# Patient Record
Sex: Male | Born: 1940
Health system: Southern US, Community
[De-identification: ages and names within clinical notes are randomized; demographics above are authoritative.]

---

## 2017-06-30 DIAGNOSIS — R079 Chest pain, unspecified: Secondary | ICD-10-CM | POA: Diagnosis not present

## 2017-07-01 DIAGNOSIS — I10 Essential (primary) hypertension: Secondary | ICD-10-CM

## 2017-07-01 DIAGNOSIS — R079 Chest pain, unspecified: Secondary | ICD-10-CM

## 2017-07-01 DIAGNOSIS — I214 Non-ST elevation (NSTEMI) myocardial infarction: Secondary | ICD-10-CM

## 2017-07-01 DIAGNOSIS — E785 Hyperlipidemia, unspecified: Secondary | ICD-10-CM

## 2018-05-11 DIAGNOSIS — R6884 Jaw pain: Secondary | ICD-10-CM

## 2018-05-11 DIAGNOSIS — I214 Non-ST elevation (NSTEMI) myocardial infarction: Secondary | ICD-10-CM | POA: Diagnosis not present

## 2018-05-11 DIAGNOSIS — E785 Hyperlipidemia, unspecified: Secondary | ICD-10-CM

## 2019-07-10 ENCOUNTER — Ambulatory Visit: Payer: Self-pay

## 2019-07-10 ENCOUNTER — Ambulatory Visit (INDEPENDENT_AMBULATORY_CARE_PROVIDER_SITE_OTHER): Payer: Medicare Other | Admitting: Orthopaedic Surgery

## 2019-07-10 DIAGNOSIS — M25562 Pain in left knee: Secondary | ICD-10-CM

## 2019-07-10 MED ORDER — METHYLPREDNISOLONE ACETATE 40 MG/ML IJ SUSP
40.0000 mg | INTRAMUSCULAR | Status: AC | PRN
  Administered 2019-07-10: 17:00:00 40 mg via INTRA_ARTICULAR

## 2019-07-10 MED ORDER — LIDOCAINE HCL 1 % IJ SOLN
3.0000 mL | INTRAMUSCULAR | Status: AC | PRN
  Administered 2019-07-10: 17:00:00 3 mL

## 2019-07-10 NOTE — Progress Notes (Signed)
Office Visit Note   Patient: Edwin Hutchinson           Date of Birth: 1940/12/08           MRN: [id number] Visit Date: 07/10/2019              Requested by: Nicoletta Dress, MD [address],  [place] [address] PCP: Nicoletta Dress, MD   Assessment & Plan: Visit Diagnoses:  1. Acute pain of left knee     Plan: I did feel that it was worth trying a one-time steroid injection in his left knee and he agreed with this as well.  He understands the rationale behind injections as well as the risk and benefits involved.  He tolerated the injection well.  All question concerns were answered addressed.  Follow-up will be as needed.  I did communicate with he and his wife that if he continues to have problems in his knee over the next few weeks that he should call because I would next order an MRI of his left knee to rule out a meniscal tear.  Follow-Up Instructions: Return if symptoms worsen or fail to improve.   Orders:  Orders Placed This Encounter  Procedures  . Large Joint Inj  . XR Knee 1-2 Views Left   No orders of the defined types were placed in this encounter.     Procedures: Large Joint Inj: L knee on 07/10/2019 5:15 PM Indications: diagnostic evaluation and pain Details: 22 G 1.5 in needle, superolateral approach  Arthrogram: No  Medications: 3 mL lidocaine 1 %; 40 mg methylPREDNISolone acetate 40 MG/ML Outcome: tolerated well, no immediate complications Procedure, treatment alternatives, risks and benefits explained, specific risks discussed. Consent was given by the patient. Immediately prior to procedure a time out was called to verify the correct patient, procedure, equipment, support staff and site/side marked as required. Patient was prepped and draped in the usual sterile fashion.       Clinical Data: No additional findings.   Subjective: Chief Complaint  Patient presents with  . Left Knee - Pain  The patient comes in today  with acute left knee pain.  He had some type of pop that he felt the back of his knee on Monday.  He was just simply walking.  He denies any swelling but is been very full and painful in the back of his left knee.  He is never injured this knee before.  He is a very active 78 year old gentleman and does a lot of work outside.  He denies any hip pain.  Denies any numbness and tingling in his left lower extremity.  It is only really the back of his knee that hurts.  HPI  Review of Systems He currently denies any headache, chest pain, shortness of breath, fever, chills, nausea, vomiting  Objective: Vital Signs: There were no vitals taken for this visit.  Physical Exam The patient is alert and oriented x3 and in no acute distress Ortho Exam Examination of his left knee shows no effusion.  His range of motion is full.  He is painful in the popliteal area of his knee.  The pain does not over the gastrocs at all.  It seems to be more over the hamstring area and the knee joint itself.  His McMurray's exam and Lockman's exam are negative and his range of motion is full. Specialty Comments:  No specialty comments available.  Imaging: Xr Knee 1-2 Views Left  Result Date: 07/10/2019 An AP and lateral of the left knee show no acute findings.  The joint space is well-maintained and the alignment is well-maintained.    PMFS History: There are no active problems to display for this patient.  No past medical history on file.  No family history on file.   Social History   Occupational History  . Not on file  Tobacco Use  . Smoking status: Not on file  Substance and Sexual Activity  . Alcohol use: Not on file  . Drug use: Not on file  . Sexual activity: Not on file

## 2019-07-22 ENCOUNTER — Telehealth: Payer: Self-pay

## 2019-07-22 ENCOUNTER — Ambulatory Visit (INDEPENDENT_AMBULATORY_CARE_PROVIDER_SITE_OTHER): Payer: Medicare Other | Admitting: Physician Assistant

## 2019-07-22 ENCOUNTER — Ambulatory Visit (HOSPITAL_COMMUNITY)
Admission: RE | Admit: 2019-07-22 | Discharge: 2019-07-22 | Disposition: A | Discharge status: Home / Self Care | Payer: Medicare Other | Source: Ambulatory Visit | Attending: Physician Assistant | Admitting: Physician Assistant

## 2019-07-22 ENCOUNTER — Other Ambulatory Visit: Payer: Self-pay

## 2019-07-22 ENCOUNTER — Encounter: Payer: Self-pay | Admitting: Physician Assistant

## 2019-07-22 DIAGNOSIS — G8929 Other chronic pain: Secondary | ICD-10-CM

## 2019-07-22 DIAGNOSIS — M25562 Pain in left knee: Secondary | ICD-10-CM | POA: Insufficient documentation

## 2019-07-22 NOTE — Telephone Encounter (Signed)
Edwin Hutchinson with [facility] wanted to let Edwin Hutchinson know that patient is Negative for DVT, left leg.

## 2019-07-22 NOTE — Telephone Encounter (Signed)
FYI

## 2019-07-22 NOTE — Progress Notes (Signed)
HPI: Edwin Hutchinson comes in today due to increasing left knee pain status post intra-articular injection 1 week ago.  States that the injection actually made his knee pain worse.  He is now having swelling in his entire left leg.  Is concerned about possible DVT.  He states most of his pain is posterior aspect of the left knee.  Physical exam: Left knee full extension full flexion.  Fullness posterior aspect consistent with possible Baker's cyst.  He has tenderness left calf.  Nonpitting edema left leg.  Definite difference in his left ankle compared to the right due to swelling is hard to make out the lateral and medial malleoli.  Impression: Left knee pain Left lower leg swelling  Plan: We will obtain an ultrasound of his left lower leg to rule out DVT.  We will also obtain an MRI of his left knee to rule out meniscal tear.  And follow-up after the MRI of the left knee to go over results discuss further treatment.  Questions encouraged and answered both patient and his wife is present throughout exam today

## 2019-07-22 NOTE — Progress Notes (Signed)
LLE venous duplex       has been completed. Preliminary results can be found under CV proc through chart review. June Leap, BS, RDMS, RVT  Called results to ortho care office.

## 2019-07-23 ENCOUNTER — Ambulatory Visit
Admission: RE | Admit: 2019-07-23 | Discharge: 2019-07-23 | Disposition: A | Discharge status: Home / Self Care | Payer: Medicare Other | Source: Ambulatory Visit | Attending: Physician Assistant | Admitting: Physician Assistant

## 2019-07-23 DIAGNOSIS — M25562 Pain in left knee: Secondary | ICD-10-CM

## 2019-07-23 DIAGNOSIS — G8929 Other chronic pain: Secondary | ICD-10-CM

## 2019-07-29 ENCOUNTER — Ambulatory Visit (INDEPENDENT_AMBULATORY_CARE_PROVIDER_SITE_OTHER): Payer: Medicare Other | Admitting: Orthopaedic Surgery

## 2019-07-29 ENCOUNTER — Encounter: Payer: Self-pay | Admitting: Orthopaedic Surgery

## 2019-07-29 DIAGNOSIS — G8929 Other chronic pain: Secondary | ICD-10-CM

## 2019-07-29 DIAGNOSIS — M25562 Pain in left knee: Secondary | ICD-10-CM

## 2019-07-29 NOTE — Progress Notes (Signed)
HPI Mr. Edwin Hutchinson returns today to go over the MRI of his left knee.  He continues to have fullness in the back of the leg.  States he is having no mechanical symptoms in the knee.  He did have a Doppler of his leg just to rule out a DVT which was negative.  MRI is reviewed with the patient.  MRI of the left knee showed radial tear through the root of the posterior horn of the medial meniscus and a small horizontal tear posterior body of the meniscus.  Ruptured Baker's cyst with large volume of fluid extending down into the gastrocnemius.  Patella femoral joint with extensive severe cartilage loss involving the mid and upper poles of the patella.  Moderate degeneration medial compartment.  Impression: Right knee pain with ruptured Baker's cyst and osteoarthritis.  Plan: This point time he is having no mechanical symptoms.  He does not wish to proceed with any type of surgical intervention.  He may benefit from supplemental injection in the future.  He will follow-up with [place] on as-needed basis.  Questions encouraged and answered at length today.  He is to work on quad strengthening and avoid deep squats and lunges for exercise.

## 2019-12-30 DIAGNOSIS — Z9861 Coronary angioplasty status: Secondary | ICD-10-CM | POA: Diagnosis not present

## 2019-12-30 DIAGNOSIS — H25013 Cortical age-related cataract, bilateral: Secondary | ICD-10-CM | POA: Diagnosis not present

## 2019-12-30 DIAGNOSIS — H524 Presbyopia: Secondary | ICD-10-CM | POA: Diagnosis not present

## 2019-12-30 DIAGNOSIS — Z2821 Immunization not carried out because of patient refusal: Secondary | ICD-10-CM | POA: Diagnosis not present

## 2019-12-30 DIAGNOSIS — D3132 Benign neoplasm of left choroid: Secondary | ICD-10-CM | POA: Diagnosis not present

## 2019-12-30 DIAGNOSIS — Z139 Encounter for screening, unspecified: Secondary | ICD-10-CM | POA: Diagnosis not present

## 2019-12-30 DIAGNOSIS — I10 Essential (primary) hypertension: Secondary | ICD-10-CM | POA: Diagnosis not present

## 2019-12-30 DIAGNOSIS — I2510 Atherosclerotic heart disease of native coronary artery without angina pectoris: Secondary | ICD-10-CM | POA: Diagnosis not present

## 2019-12-30 DIAGNOSIS — H2513 Age-related nuclear cataract, bilateral: Secondary | ICD-10-CM | POA: Diagnosis not present

## 2019-12-30 DIAGNOSIS — E785 Hyperlipidemia, unspecified: Secondary | ICD-10-CM | POA: Diagnosis not present

## 2019-12-30 DIAGNOSIS — R7301 Impaired fasting glucose: Secondary | ICD-10-CM | POA: Diagnosis not present

## 2019-12-30 DIAGNOSIS — Z9181 History of falling: Secondary | ICD-10-CM | POA: Diagnosis not present

## 2019-12-30 DIAGNOSIS — H34232 Retinal artery branch occlusion, left eye: Secondary | ICD-10-CM | POA: Diagnosis not present

## 2019-12-30 DIAGNOSIS — H35013 Changes in retinal vascular appearance, bilateral: Secondary | ICD-10-CM | POA: Diagnosis not present

## 2020-02-26 DIAGNOSIS — D225 Melanocytic nevi of trunk: Secondary | ICD-10-CM | POA: Diagnosis not present

## 2020-02-26 DIAGNOSIS — L821 Other seborrheic keratosis: Secondary | ICD-10-CM | POA: Diagnosis not present

## 2020-02-26 DIAGNOSIS — D1801 Hemangioma of skin and subcutaneous tissue: Secondary | ICD-10-CM | POA: Diagnosis not present

## 2020-03-30 DIAGNOSIS — L02414 Cutaneous abscess of left upper limb: Secondary | ICD-10-CM | POA: Diagnosis not present

## 2020-03-30 DIAGNOSIS — L0390 Cellulitis, unspecified: Secondary | ICD-10-CM | POA: Diagnosis not present

## 2020-04-21 DIAGNOSIS — L0100 Impetigo, unspecified: Secondary | ICD-10-CM | POA: Diagnosis not present

## 2020-06-17 DIAGNOSIS — L089 Local infection of the skin and subcutaneous tissue, unspecified: Secondary | ICD-10-CM | POA: Diagnosis not present

## 2020-06-17 DIAGNOSIS — R936 Abnormal findings on diagnostic imaging of limbs: Secondary | ICD-10-CM | POA: Diagnosis not present

## 2020-06-17 DIAGNOSIS — M19042 Primary osteoarthritis, left hand: Secondary | ICD-10-CM | POA: Diagnosis not present

## 2020-06-25 ENCOUNTER — Ambulatory Visit: Payer: Medicare PPO | Admitting: Orthopaedic Surgery

## 2020-06-25 ENCOUNTER — Encounter: Payer: Self-pay | Admitting: Orthopaedic Surgery

## 2020-06-25 DIAGNOSIS — L03012 Cellulitis of left finger: Secondary | ICD-10-CM

## 2020-06-25 NOTE — Progress Notes (Signed)
Office Visit Note   Patient: Edwin Hutchinson           Date of Birth: Oct 16, 1941           MRN: [id number] Visit Date: 06/25/2020              Requested by: Paulina Fusi, MD [address] [place],  [place] [address] PCP: Paulina Fusi, MD   Assessment & Plan: Visit Diagnoses:  1. 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  I recommended a formal irrigation debridement in an operative setting which can be done under just light sedation and a digital nerve block.  We would need to open the tissues up and explore the area and irrigated out look for any other foreign bodies.  We will work on getting the surgery set up the next week and then see him back in 2 weeks postoperative.  All questions and concerns were answered and addressed.  Follow-Up Instructions: Return for 2 weeks post-op.   Orders:  No orders of the defined types were placed in this encounter.  No orders of the defined types were placed in this encounter.     Procedures: No procedures performed   Clinical Data: No additional findings.   Subjective: Chief Complaint  Patient presents with  . Left Middle Finger - Pain  The patient comes in today for evaluation treatment of a nonhealing wound on the palmar surface of his left fingertip.  Earlier this year and around April he pulled out a splinter from the end of his finger.  Since then he has been to a dermatologist twice in May and June and they have pushed out some purulent material.  Is been on clindamycin as well.  X-rays were done recently that suggested a foreign body.  However I reviewed his x-rays and the foreign body is actually some type of old calcification or bone spurring and its actually nowhere near where the splinter came out.  The splinter came out on the palmar side of his finger at the tip and this calcification that is seen under plain films his  dorsum and proximal.  He says there is some little discomfort with pressure on that area.  HPI  Review of Systems He currently denies any headache, chest pain, shortness of breath, fever, chills, nausea, vomiting  Objective: Vital Signs: There were no vitals taken for this visit.  Physical Exam He is alert and orient x3 and in no acute distress Ortho Exam Examination of his left hand shows a chronic wound at the palmar aspect of the fingertip.  There is 2 small wounds to suggest there was a tract from a foreign body.  There still could be retained foreign body based on what I am seeing.  The area on the dorsum of his finger where the calcifications pointed out is asymptomatic. Specialty Comments:  No specialty comments available.  Imaging: No results found. X-rays were reviewed on the canopy system of his left middle finger.  There is no evidence of osteomyelitis of the tip and he cannot see a foreign body in the soft tissue at the tip of his finger the palmar surface were the symptoms and drainage are occurring.  PMFS History: There are no problems to display for this patient.  History reviewed. No pertinent past medical history.  History reviewed. No pertinent family history.  History reviewed. No pertinent surgical history. Social History   Occupational History  . Not on file  Tobacco Use  . Smoking status: Never Smoker  . Smokeless tobacco: Never Used  Substance and Sexual Activity  . Alcohol use: Not on file  . Drug use: Not on file  . Sexual activity: Not on file

## 2020-07-16 ENCOUNTER — Inpatient Hospital Stay: Payer: Medicare PPO | Admitting: Physician Assistant

## 2020-07-16 ENCOUNTER — Other Ambulatory Visit: Payer: Self-pay | Admitting: Orthopaedic Surgery

## 2020-07-16 DIAGNOSIS — L03012 Cellulitis of left finger: Secondary | ICD-10-CM | POA: Diagnosis not present

## 2020-07-16 MED ORDER — TRAMADOL HCL 50 MG PO TABS: 50.0000 mg | ORAL_TABLET | Freq: Four times a day (QID) | ORAL | 0 refills | Status: AC | PRN

## 2020-07-30 ENCOUNTER — Ambulatory Visit (INDEPENDENT_AMBULATORY_CARE_PROVIDER_SITE_OTHER): Payer: Medicare PPO | Admitting: Orthopaedic Surgery

## 2020-07-30 ENCOUNTER — Encounter: Payer: Self-pay | Admitting: Orthopaedic Surgery

## 2020-07-30 DIAGNOSIS — L03012 Cellulitis of left finger: Secondary | ICD-10-CM

## 2020-07-30 NOTE — Progress Notes (Signed)
The patient comes in today 2 weeks after an I&D of a chronic infection involving his left middle finger.  He does report numbness in his fingertips and this is to the radial side.  There was potentially a foreign body but it is really hard to tell.  It was a chronic infection.  I did remove the 2 sutures from his finger today on the volar aspect of the left middle finger.  I will have now placed Bactroban ointment and a Band-Aid on this area daily.  He can use his finger as comfort allows but he will be careful with it.  Hopefully some of the numbness will resolve with time.  I would like to see him back in a month to see how he is doing overall.  All questions and concerns were answered and addressed.

## 2020-08-27 ENCOUNTER — Ambulatory Visit (INDEPENDENT_AMBULATORY_CARE_PROVIDER_SITE_OTHER): Payer: Medicare PPO | Admitting: Orthopaedic Surgery

## 2020-08-27 ENCOUNTER — Encounter: Payer: Self-pay | Admitting: Orthopaedic Surgery

## 2020-08-27 DIAGNOSIS — L03012 Cellulitis of left finger: Secondary | ICD-10-CM

## 2020-08-27 NOTE — Progress Notes (Signed)
Following up status post surgery on his left middle finger due to a chronic infection of the palmar aspect.  We were able to ellipse out what ever sinus tract was there.  He says that is doing much better and he is off antibiotics.  We did surgery about a month or more ago.  He denies any fever and chills.  He says he thinks things are healing up nicely.  There is still some slight sensory deficit at the radial tip of the little finger.  The wound itself looks good overall.  There is no redness and no drainage and it looks like the soft tissue is healing.  At this point follow-up can be as needed.  If this does change for him or worsen in any way he will let us know.

## 2020-08-31 DIAGNOSIS — I10 Essential (primary) hypertension: Secondary | ICD-10-CM | POA: Diagnosis not present

## 2020-08-31 DIAGNOSIS — E785 Hyperlipidemia, unspecified: Secondary | ICD-10-CM | POA: Diagnosis not present

## 2020-08-31 DIAGNOSIS — E559 Vitamin D deficiency, unspecified: Secondary | ICD-10-CM | POA: Diagnosis not present

## 2020-08-31 DIAGNOSIS — I2510 Atherosclerotic heart disease of native coronary artery without angina pectoris: Secondary | ICD-10-CM | POA: Diagnosis not present

## 2020-08-31 DIAGNOSIS — Z9861 Coronary angioplasty status: Secondary | ICD-10-CM | POA: Diagnosis not present

## 2020-08-31 DIAGNOSIS — R7301 Impaired fasting glucose: Secondary | ICD-10-CM | POA: Diagnosis not present

## 2020-08-31 DIAGNOSIS — Z125 Encounter for screening for malignant neoplasm of prostate: Secondary | ICD-10-CM | POA: Diagnosis not present

## 2020-09-07 DIAGNOSIS — I214 Non-ST elevation (NSTEMI) myocardial infarction: Secondary | ICD-10-CM | POA: Diagnosis not present

## 2020-09-07 DIAGNOSIS — I208 Other forms of angina pectoris: Secondary | ICD-10-CM | POA: Diagnosis not present

## 2020-09-07 DIAGNOSIS — I10 Essential (primary) hypertension: Secondary | ICD-10-CM | POA: Diagnosis not present

## 2020-09-07 DIAGNOSIS — R001 Bradycardia, unspecified: Secondary | ICD-10-CM | POA: Diagnosis not present

## 2020-09-07 DIAGNOSIS — E7800 Pure hypercholesterolemia, unspecified: Secondary | ICD-10-CM | POA: Diagnosis not present

## 2020-09-11 IMAGING — MR MR KNEE*L* W/O CM
4 of 6 series · 23 of 40 positions shown · non-contrast
Comparison: Plain films left knee 07/10/2019.

CLINICAL DATA: Chronic left knee pain. The patient reports a knot
on the posterior aspect of the left knee. No known injury.

EXAM:
MRI OF THE LEFT KNEE WITHOUT CONTRAST
TECHNIQUE: Multiplanar, multisequence MR imaging of the knee was performed. No
intravenous contrast was administered.

[Series 4: T2 fat-sat · coronal · 4.0mm · 0.59mm/px · 6 of 27 slices shown (1 of 2)]
[im 1/27]
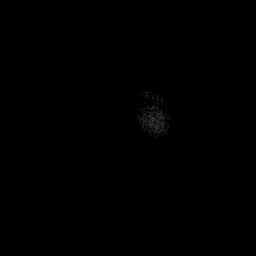
[im 6/27]
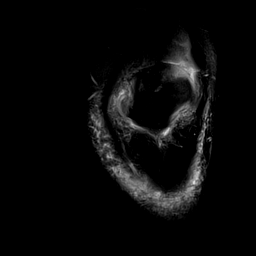
[im 11/27]
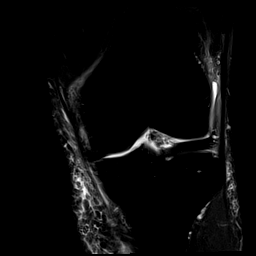
[im 16/27]
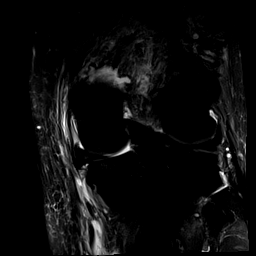
[im 21/27]
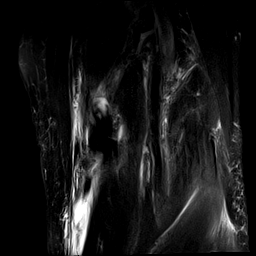
[im 27/27]
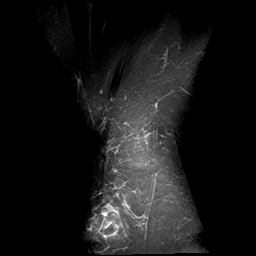

[Series 5: T1 · coronal · 4.0mm · 0.29mm/px · 3 of 28 slices shown]
[im 6/28]
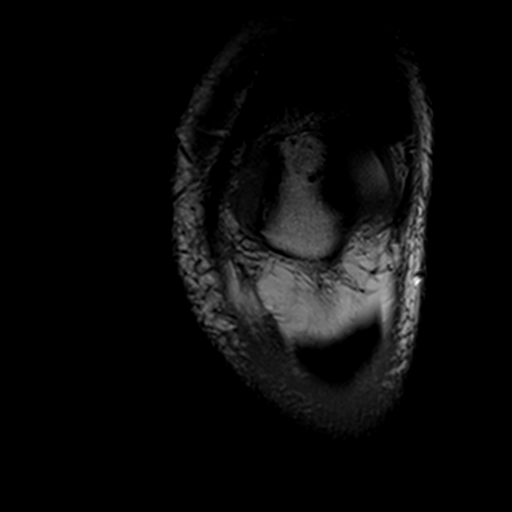
[im 17/28]
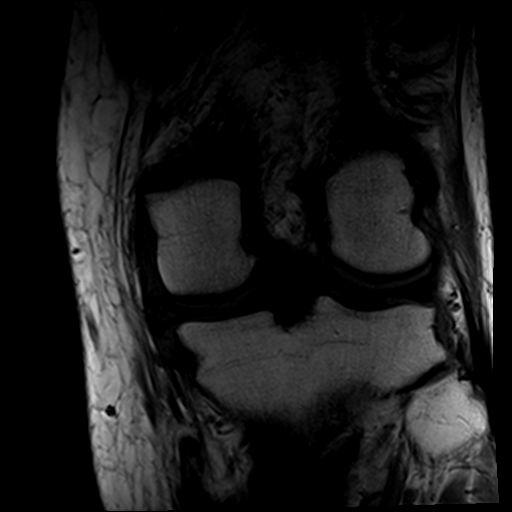
[im 28/28]
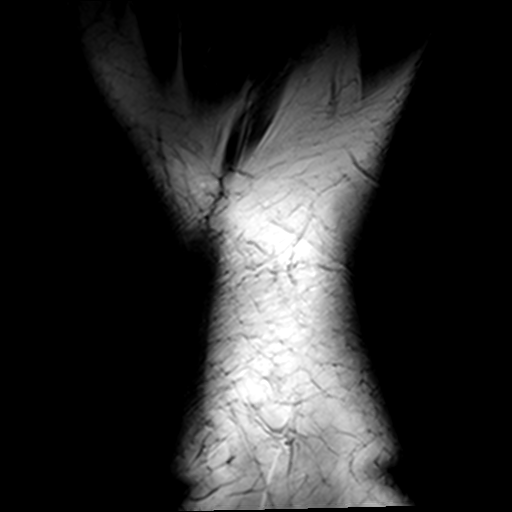

[Series 7: T2 fat-sat · sagittal · 3.0mm · 0.29mm/px · 7 of 32 slices shown (2 of 2)]
[im 1/32]
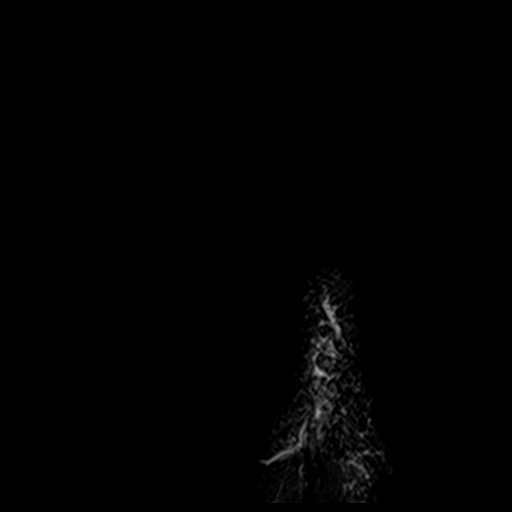
[im 6/32]
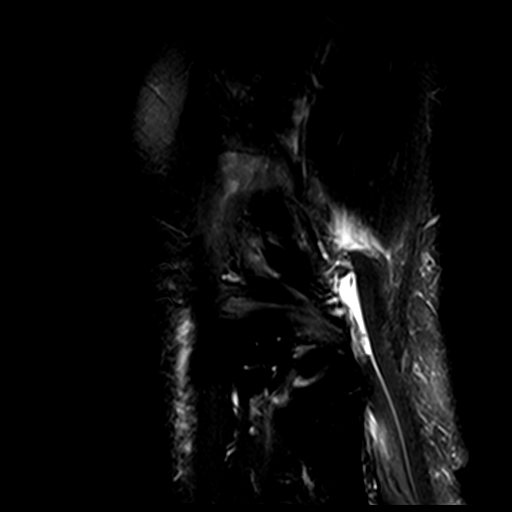
[im 11/32]
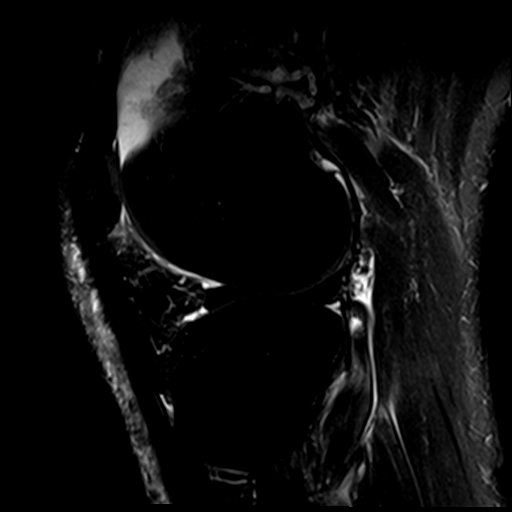
[im 16/32]
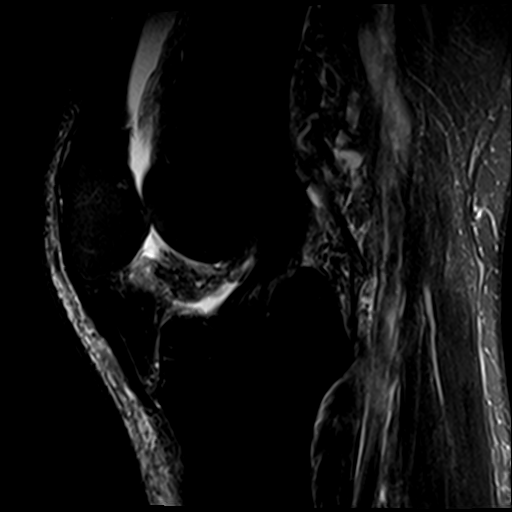
[im 21/32]
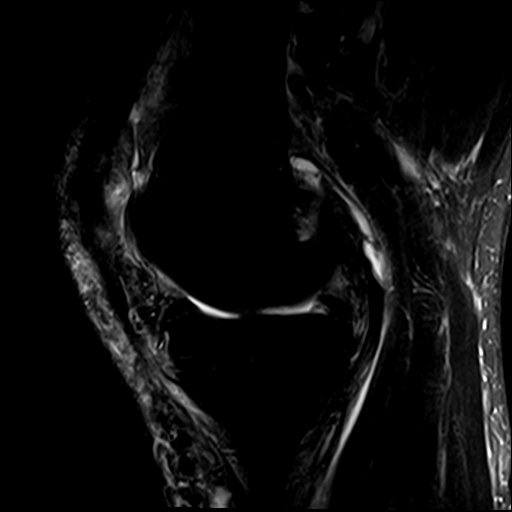
[im 26/32]
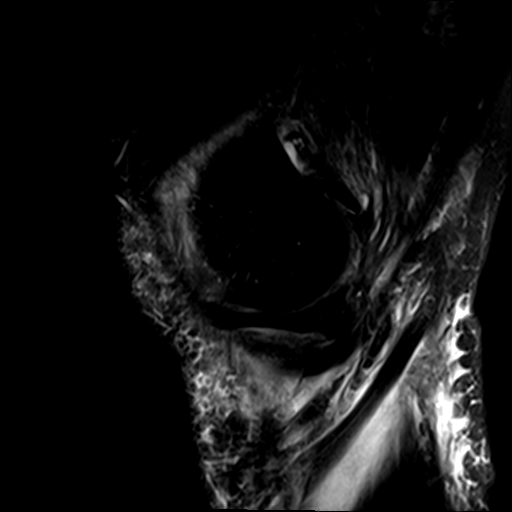
[im 32/32]
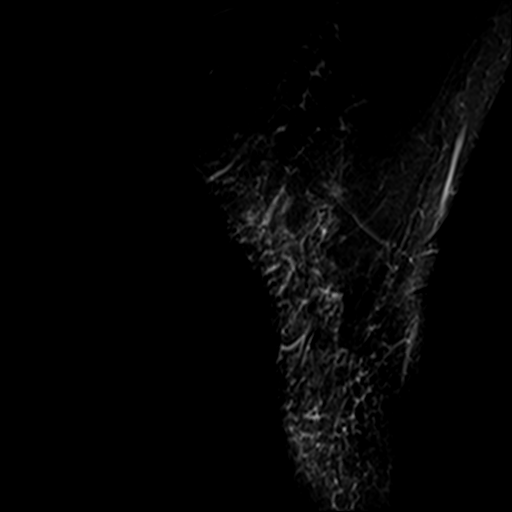

[Series 8: PD fat-sat · sagittal · 3.0mm · 0.29mm/px · 7 of 32 slices shown]
[im 1/32]
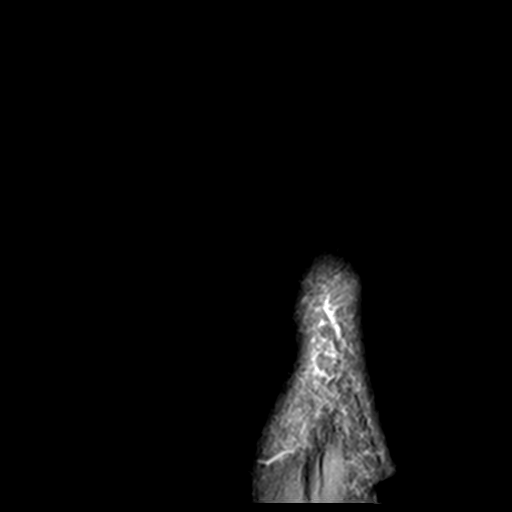
[im 6/32]
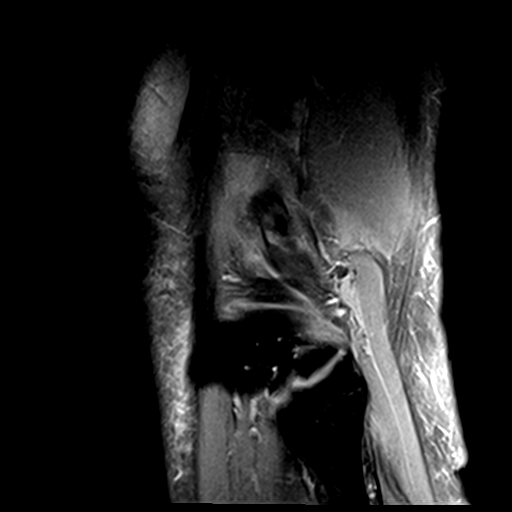
[im 11/32]
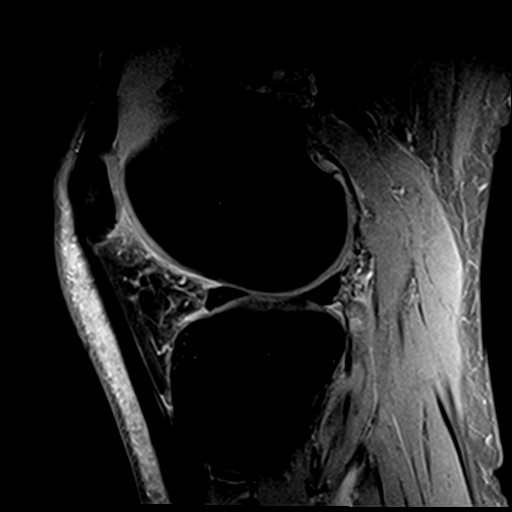
[im 16/32]
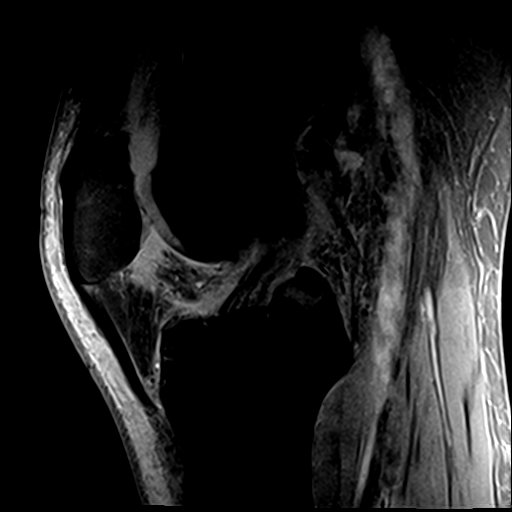
[im 21/32]
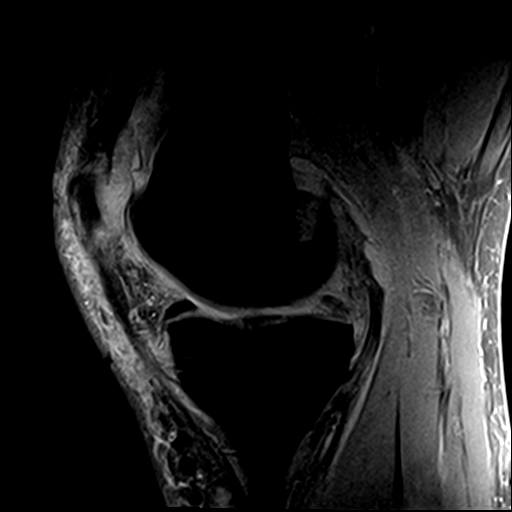
[im 26/32]
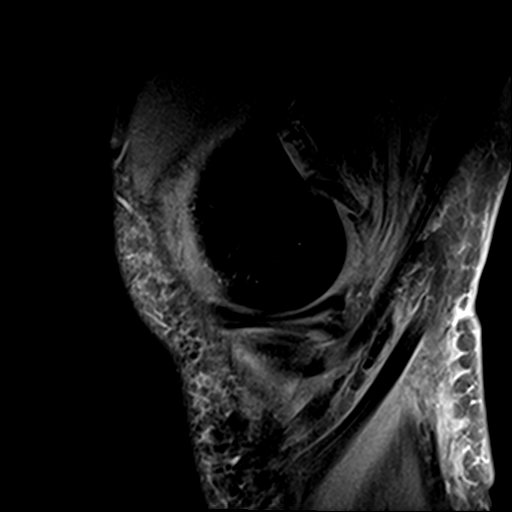
[im 32/32]
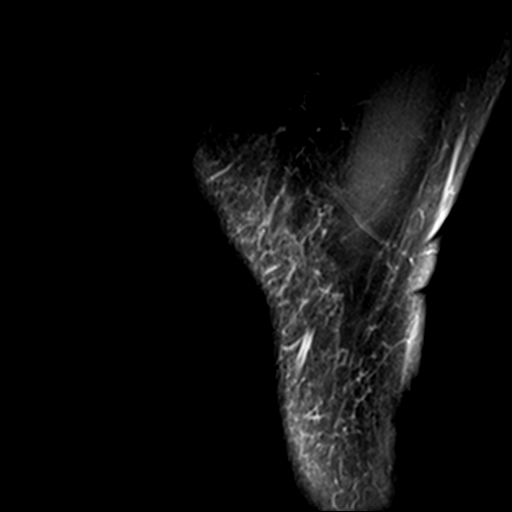

[23 of 40 positions shown; findings below may reference images not displayed]

FINDINGS: MENISCI

Medial meniscus: Complete radial tear through the root of the
posterior horn is identified. Extensive intrasubstance degenerative
signal is seen in the posterior horn and posterior body. In the
posterior body, a small appearing horizontal tear reaching the
meniscal undersurface is identified.

Lateral meniscus:  Intact.

LIGAMENTS

Cruciates:  Intact.

Collaterals:  Intact.

CARTILAGE

Patellofemoral: Extensive, severe cartilage loss is present in the
mid and upper poles of the patella.

Medial:  Moderately degenerated.

Lateral:  Minimally degenerated.

Joint:  Small joint effusion.

Popliteal Fossa: Baker's cyst has ruptured with a large volume of
fluid dissecting along the superficial aspect of the medial
gastrocnemius and proximal tibia. Fluid is incompletely visualized.

Extensor Mechanism:  Intact.

Bones: No fracture or focal lesion. Mild subchondral edema about the
medial compartment noted.

Other: None.
IMPRESSION: Radial tear through the root of the posterior horn of the medial
meniscus. Also seen is a small horizontal tear in the posterior body
of the medial meniscus reaching the meniscal undersurface.

Ruptured Baker's cyst with a large volume of fluid dissecting along
the superficial aspect of the medial gastrocnemius and proximal
tibia. Fluid is incompletely visualized.

Patellofemoral and medial compartment osteoarthritis. Mild
subchondral edema about the medial compartment is seen.

## 2021-03-01 DIAGNOSIS — R053 Chronic cough: Secondary | ICD-10-CM | POA: Diagnosis not present

## 2021-03-01 DIAGNOSIS — I10 Essential (primary) hypertension: Secondary | ICD-10-CM | POA: Diagnosis not present

## 2021-03-01 DIAGNOSIS — Z9861 Coronary angioplasty status: Secondary | ICD-10-CM | POA: Diagnosis not present

## 2021-03-01 DIAGNOSIS — Z1331 Encounter for screening for depression: Secondary | ICD-10-CM | POA: Diagnosis not present

## 2021-03-01 DIAGNOSIS — R5382 Chronic fatigue, unspecified: Secondary | ICD-10-CM | POA: Diagnosis not present

## 2021-03-01 DIAGNOSIS — Z9181 History of falling: Secondary | ICD-10-CM | POA: Diagnosis not present

## 2021-03-01 DIAGNOSIS — E559 Vitamin D deficiency, unspecified: Secondary | ICD-10-CM | POA: Diagnosis not present

## 2021-03-01 DIAGNOSIS — Z139 Encounter for screening, unspecified: Secondary | ICD-10-CM | POA: Diagnosis not present

## 2021-03-01 DIAGNOSIS — R059 Cough, unspecified: Secondary | ICD-10-CM | POA: Diagnosis not present

## 2021-03-01 DIAGNOSIS — E785 Hyperlipidemia, unspecified: Secondary | ICD-10-CM | POA: Diagnosis not present

## 2021-03-01 DIAGNOSIS — R7301 Impaired fasting glucose: Secondary | ICD-10-CM | POA: Diagnosis not present

## 2021-03-01 DIAGNOSIS — I2510 Atherosclerotic heart disease of native coronary artery without angina pectoris: Secondary | ICD-10-CM | POA: Diagnosis not present

## 2021-04-02 DIAGNOSIS — E039 Hypothyroidism, unspecified: Secondary | ICD-10-CM | POA: Diagnosis not present

## 2021-04-10 DIAGNOSIS — I10 Essential (primary) hypertension: Secondary | ICD-10-CM | POA: Diagnosis not present

## 2021-04-10 DIAGNOSIS — K2970 Gastritis, unspecified, without bleeding: Secondary | ICD-10-CM | POA: Diagnosis not present

## 2021-04-10 DIAGNOSIS — K4090 Unilateral inguinal hernia, without obstruction or gangrene, not specified as recurrent: Secondary | ICD-10-CM | POA: Diagnosis not present

## 2021-04-10 DIAGNOSIS — N200 Calculus of kidney: Secondary | ICD-10-CM | POA: Diagnosis not present

## 2021-04-10 DIAGNOSIS — R079 Chest pain, unspecified: Secondary | ICD-10-CM | POA: Diagnosis not present

## 2021-04-10 DIAGNOSIS — K859 Acute pancreatitis without necrosis or infection, unspecified: Secondary | ICD-10-CM | POA: Diagnosis not present

## 2021-04-10 DIAGNOSIS — I700 Atherosclerosis of aorta: Secondary | ICD-10-CM | POA: Diagnosis not present

## 2021-04-10 DIAGNOSIS — M47816 Spondylosis without myelopathy or radiculopathy, lumbar region: Secondary | ICD-10-CM | POA: Diagnosis not present

## 2021-04-10 DIAGNOSIS — R109 Unspecified abdominal pain: Secondary | ICD-10-CM | POA: Diagnosis not present

## 2021-04-10 DIAGNOSIS — M47814 Spondylosis without myelopathy or radiculopathy, thoracic region: Secondary | ICD-10-CM | POA: Diagnosis not present

## 2021-04-10 DIAGNOSIS — I2510 Atherosclerotic heart disease of native coronary artery without angina pectoris: Secondary | ICD-10-CM | POA: Diagnosis not present

## 2021-04-10 DIAGNOSIS — K2980 Duodenitis without bleeding: Secondary | ICD-10-CM | POA: Diagnosis not present

## 2021-04-11 DIAGNOSIS — K4090 Unilateral inguinal hernia, without obstruction or gangrene, not specified as recurrent: Secondary | ICD-10-CM | POA: Diagnosis not present

## 2021-04-11 DIAGNOSIS — N200 Calculus of kidney: Secondary | ICD-10-CM | POA: Diagnosis not present

## 2021-04-11 DIAGNOSIS — K859 Acute pancreatitis without necrosis or infection, unspecified: Secondary | ICD-10-CM | POA: Diagnosis not present

## 2021-04-11 DIAGNOSIS — M47814 Spondylosis without myelopathy or radiculopathy, thoracic region: Secondary | ICD-10-CM | POA: Diagnosis not present

## 2021-04-11 DIAGNOSIS — I700 Atherosclerosis of aorta: Secondary | ICD-10-CM | POA: Diagnosis not present

## 2021-04-11 DIAGNOSIS — Z8673 Personal history of transient ischemic attack (TIA), and cerebral infarction without residual deficits: Secondary | ICD-10-CM | POA: Diagnosis not present

## 2021-04-11 DIAGNOSIS — M47816 Spondylosis without myelopathy or radiculopathy, lumbar region: Secondary | ICD-10-CM | POA: Diagnosis not present

## 2021-04-11 DIAGNOSIS — K2980 Duodenitis without bleeding: Secondary | ICD-10-CM | POA: Diagnosis not present

## 2021-04-11 DIAGNOSIS — Z955 Presence of coronary angioplasty implant and graft: Secondary | ICD-10-CM | POA: Diagnosis not present

## 2021-04-11 DIAGNOSIS — K2970 Gastritis, unspecified, without bleeding: Secondary | ICD-10-CM | POA: Diagnosis not present

## 2021-04-11 DIAGNOSIS — E039 Hypothyroidism, unspecified: Secondary | ICD-10-CM | POA: Diagnosis not present

## 2021-04-11 DIAGNOSIS — K760 Fatty (change of) liver, not elsewhere classified: Secondary | ICD-10-CM | POA: Diagnosis not present

## 2021-04-11 DIAGNOSIS — I10 Essential (primary) hypertension: Secondary | ICD-10-CM | POA: Diagnosis not present

## 2021-04-11 DIAGNOSIS — R079 Chest pain, unspecified: Secondary | ICD-10-CM | POA: Diagnosis not present

## 2021-04-11 DIAGNOSIS — I2510 Atherosclerotic heart disease of native coronary artery without angina pectoris: Secondary | ICD-10-CM | POA: Diagnosis not present

## 2021-04-11 DIAGNOSIS — E785 Hyperlipidemia, unspecified: Secondary | ICD-10-CM | POA: Diagnosis not present

## 2021-04-11 DIAGNOSIS — R109 Unspecified abdominal pain: Secondary | ICD-10-CM | POA: Diagnosis not present

## 2021-04-11 DIAGNOSIS — Z7982 Long term (current) use of aspirin: Secondary | ICD-10-CM | POA: Diagnosis not present

## 2021-04-16 DIAGNOSIS — K859 Acute pancreatitis without necrosis or infection, unspecified: Secondary | ICD-10-CM | POA: Diagnosis not present

## 2021-04-16 DIAGNOSIS — K2980 Duodenitis without bleeding: Secondary | ICD-10-CM | POA: Diagnosis not present

## 2021-04-16 DIAGNOSIS — R197 Diarrhea, unspecified: Secondary | ICD-10-CM | POA: Diagnosis not present

## 2021-04-20 DIAGNOSIS — K2980 Duodenitis without bleeding: Secondary | ICD-10-CM | POA: Diagnosis not present

## 2021-04-20 DIAGNOSIS — R197 Diarrhea, unspecified: Secondary | ICD-10-CM | POA: Diagnosis not present

## 2021-05-11 DIAGNOSIS — L820 Inflamed seborrheic keratosis: Secondary | ICD-10-CM | POA: Diagnosis not present

## 2021-06-15 DIAGNOSIS — A6920 Lyme disease, unspecified: Secondary | ICD-10-CM | POA: Diagnosis not present

## 2021-09-21 DIAGNOSIS — H35013 Changes in retinal vascular appearance, bilateral: Secondary | ICD-10-CM | POA: Diagnosis not present

## 2021-09-21 DIAGNOSIS — E559 Vitamin D deficiency, unspecified: Secondary | ICD-10-CM | POA: Diagnosis not present

## 2021-09-21 DIAGNOSIS — I2510 Atherosclerotic heart disease of native coronary artery without angina pectoris: Secondary | ICD-10-CM | POA: Diagnosis not present

## 2021-09-21 DIAGNOSIS — H25013 Cortical age-related cataract, bilateral: Secondary | ICD-10-CM | POA: Diagnosis not present

## 2021-09-21 DIAGNOSIS — Z125 Encounter for screening for malignant neoplasm of prostate: Secondary | ICD-10-CM | POA: Diagnosis not present

## 2021-09-21 DIAGNOSIS — D3132 Benign neoplasm of left choroid: Secondary | ICD-10-CM | POA: Diagnosis not present

## 2021-09-21 DIAGNOSIS — I10 Essential (primary) hypertension: Secondary | ICD-10-CM | POA: Diagnosis not present

## 2021-09-21 DIAGNOSIS — R7301 Impaired fasting glucose: Secondary | ICD-10-CM | POA: Diagnosis not present

## 2021-09-21 DIAGNOSIS — H2513 Age-related nuclear cataract, bilateral: Secondary | ICD-10-CM | POA: Diagnosis not present

## 2021-09-21 DIAGNOSIS — H02839 Dermatochalasis of unspecified eye, unspecified eyelid: Secondary | ICD-10-CM | POA: Diagnosis not present

## 2021-09-21 DIAGNOSIS — Z9861 Coronary angioplasty status: Secondary | ICD-10-CM | POA: Diagnosis not present

## 2021-09-21 DIAGNOSIS — E785 Hyperlipidemia, unspecified: Secondary | ICD-10-CM | POA: Diagnosis not present

## 2021-09-21 DIAGNOSIS — R7989 Other specified abnormal findings of blood chemistry: Secondary | ICD-10-CM | POA: Diagnosis not present

## 2021-09-21 DIAGNOSIS — Z0000 Encounter for general adult medical examination without abnormal findings: Secondary | ICD-10-CM | POA: Diagnosis not present

## 2021-09-21 DIAGNOSIS — H524 Presbyopia: Secondary | ICD-10-CM | POA: Diagnosis not present

## 2021-09-21 DIAGNOSIS — H34232 Retinal artery branch occlusion, left eye: Secondary | ICD-10-CM | POA: Diagnosis not present

## 2021-11-02 DIAGNOSIS — L720 Epidermal cyst: Secondary | ICD-10-CM | POA: Diagnosis not present

## 2021-11-02 DIAGNOSIS — L821 Other seborrheic keratosis: Secondary | ICD-10-CM | POA: Diagnosis not present

## 2021-11-02 DIAGNOSIS — L820 Inflamed seborrheic keratosis: Secondary | ICD-10-CM | POA: Diagnosis not present

## 2022-04-15 DIAGNOSIS — L814 Other melanin hyperpigmentation: Secondary | ICD-10-CM | POA: Diagnosis not present

## 2022-04-15 DIAGNOSIS — L820 Inflamed seborrheic keratosis: Secondary | ICD-10-CM | POA: Diagnosis not present

## 2022-04-15 DIAGNOSIS — L821 Other seborrheic keratosis: Secondary | ICD-10-CM | POA: Diagnosis not present

## 2022-04-15 DIAGNOSIS — L570 Actinic keratosis: Secondary | ICD-10-CM | POA: Diagnosis not present

## 2022-04-20 DIAGNOSIS — H6123 Impacted cerumen, bilateral: Secondary | ICD-10-CM | POA: Diagnosis not present

## 2022-04-20 DIAGNOSIS — J3489 Other specified disorders of nose and nasal sinuses: Secondary | ICD-10-CM | POA: Diagnosis not present

## 2022-04-27 DIAGNOSIS — M9902 Segmental and somatic dysfunction of thoracic region: Secondary | ICD-10-CM | POA: Diagnosis not present

## 2022-04-27 DIAGNOSIS — M9903 Segmental and somatic dysfunction of lumbar region: Secondary | ICD-10-CM | POA: Diagnosis not present

## 2022-04-27 DIAGNOSIS — M9905 Segmental and somatic dysfunction of pelvic region: Secondary | ICD-10-CM | POA: Diagnosis not present

## 2022-04-27 DIAGNOSIS — M9901 Segmental and somatic dysfunction of cervical region: Secondary | ICD-10-CM | POA: Diagnosis not present

## 2022-04-29 DIAGNOSIS — M9905 Segmental and somatic dysfunction of pelvic region: Secondary | ICD-10-CM | POA: Diagnosis not present

## 2022-04-29 DIAGNOSIS — M9903 Segmental and somatic dysfunction of lumbar region: Secondary | ICD-10-CM | POA: Diagnosis not present

## 2022-04-29 DIAGNOSIS — M9901 Segmental and somatic dysfunction of cervical region: Secondary | ICD-10-CM | POA: Diagnosis not present

## 2022-04-29 DIAGNOSIS — M9902 Segmental and somatic dysfunction of thoracic region: Secondary | ICD-10-CM | POA: Diagnosis not present

## 2022-05-02 DIAGNOSIS — M9901 Segmental and somatic dysfunction of cervical region: Secondary | ICD-10-CM | POA: Diagnosis not present

## 2022-05-02 DIAGNOSIS — M9903 Segmental and somatic dysfunction of lumbar region: Secondary | ICD-10-CM | POA: Diagnosis not present

## 2022-05-02 DIAGNOSIS — M9905 Segmental and somatic dysfunction of pelvic region: Secondary | ICD-10-CM | POA: Diagnosis not present

## 2022-05-02 DIAGNOSIS — M9902 Segmental and somatic dysfunction of thoracic region: Secondary | ICD-10-CM | POA: Diagnosis not present

## 2022-05-06 DIAGNOSIS — M9903 Segmental and somatic dysfunction of lumbar region: Secondary | ICD-10-CM | POA: Diagnosis not present

## 2022-05-06 DIAGNOSIS — M9901 Segmental and somatic dysfunction of cervical region: Secondary | ICD-10-CM | POA: Diagnosis not present

## 2022-05-06 DIAGNOSIS — M9905 Segmental and somatic dysfunction of pelvic region: Secondary | ICD-10-CM | POA: Diagnosis not present

## 2022-05-06 DIAGNOSIS — M9902 Segmental and somatic dysfunction of thoracic region: Secondary | ICD-10-CM | POA: Diagnosis not present

## 2022-05-13 DIAGNOSIS — M9903 Segmental and somatic dysfunction of lumbar region: Secondary | ICD-10-CM | POA: Diagnosis not present

## 2022-05-13 DIAGNOSIS — M9905 Segmental and somatic dysfunction of pelvic region: Secondary | ICD-10-CM | POA: Diagnosis not present

## 2022-05-13 DIAGNOSIS — M9902 Segmental and somatic dysfunction of thoracic region: Secondary | ICD-10-CM | POA: Diagnosis not present

## 2022-05-13 DIAGNOSIS — M9901 Segmental and somatic dysfunction of cervical region: Secondary | ICD-10-CM | POA: Diagnosis not present

## 2022-05-16 DIAGNOSIS — M9901 Segmental and somatic dysfunction of cervical region: Secondary | ICD-10-CM | POA: Diagnosis not present

## 2022-05-16 DIAGNOSIS — M9902 Segmental and somatic dysfunction of thoracic region: Secondary | ICD-10-CM | POA: Diagnosis not present

## 2022-05-16 DIAGNOSIS — M9903 Segmental and somatic dysfunction of lumbar region: Secondary | ICD-10-CM | POA: Diagnosis not present

## 2022-05-16 DIAGNOSIS — M9905 Segmental and somatic dysfunction of pelvic region: Secondary | ICD-10-CM | POA: Diagnosis not present

## 2022-05-20 DIAGNOSIS — M9903 Segmental and somatic dysfunction of lumbar region: Secondary | ICD-10-CM | POA: Diagnosis not present

## 2022-05-20 DIAGNOSIS — M9901 Segmental and somatic dysfunction of cervical region: Secondary | ICD-10-CM | POA: Diagnosis not present

## 2022-05-20 DIAGNOSIS — M9905 Segmental and somatic dysfunction of pelvic region: Secondary | ICD-10-CM | POA: Diagnosis not present

## 2022-05-20 DIAGNOSIS — M9902 Segmental and somatic dysfunction of thoracic region: Secondary | ICD-10-CM | POA: Diagnosis not present

## 2022-05-23 DIAGNOSIS — M9903 Segmental and somatic dysfunction of lumbar region: Secondary | ICD-10-CM | POA: Diagnosis not present

## 2022-05-23 DIAGNOSIS — M9905 Segmental and somatic dysfunction of pelvic region: Secondary | ICD-10-CM | POA: Diagnosis not present

## 2022-05-23 DIAGNOSIS — M9902 Segmental and somatic dysfunction of thoracic region: Secondary | ICD-10-CM | POA: Diagnosis not present

## 2022-05-23 DIAGNOSIS — M9901 Segmental and somatic dysfunction of cervical region: Secondary | ICD-10-CM | POA: Diagnosis not present

## 2022-05-27 DIAGNOSIS — M9901 Segmental and somatic dysfunction of cervical region: Secondary | ICD-10-CM | POA: Diagnosis not present

## 2022-05-27 DIAGNOSIS — M9905 Segmental and somatic dysfunction of pelvic region: Secondary | ICD-10-CM | POA: Diagnosis not present

## 2022-05-27 DIAGNOSIS — M9903 Segmental and somatic dysfunction of lumbar region: Secondary | ICD-10-CM | POA: Diagnosis not present

## 2022-05-27 DIAGNOSIS — M9902 Segmental and somatic dysfunction of thoracic region: Secondary | ICD-10-CM | POA: Diagnosis not present

## 2022-05-30 DIAGNOSIS — M9903 Segmental and somatic dysfunction of lumbar region: Secondary | ICD-10-CM | POA: Diagnosis not present

## 2022-05-30 DIAGNOSIS — M9901 Segmental and somatic dysfunction of cervical region: Secondary | ICD-10-CM | POA: Diagnosis not present

## 2022-05-30 DIAGNOSIS — M9902 Segmental and somatic dysfunction of thoracic region: Secondary | ICD-10-CM | POA: Diagnosis not present

## 2022-05-30 DIAGNOSIS — M9905 Segmental and somatic dysfunction of pelvic region: Secondary | ICD-10-CM | POA: Diagnosis not present

## 2022-06-03 DIAGNOSIS — M170 Bilateral primary osteoarthritis of knee: Secondary | ICD-10-CM | POA: Diagnosis not present

## 2022-06-03 DIAGNOSIS — M9901 Segmental and somatic dysfunction of cervical region: Secondary | ICD-10-CM | POA: Diagnosis not present

## 2022-06-03 DIAGNOSIS — M9903 Segmental and somatic dysfunction of lumbar region: Secondary | ICD-10-CM | POA: Diagnosis not present

## 2022-06-03 DIAGNOSIS — M25461 Effusion, right knee: Secondary | ICD-10-CM | POA: Diagnosis not present

## 2022-06-03 DIAGNOSIS — M9905 Segmental and somatic dysfunction of pelvic region: Secondary | ICD-10-CM | POA: Diagnosis not present

## 2022-06-03 DIAGNOSIS — M25462 Effusion, left knee: Secondary | ICD-10-CM | POA: Diagnosis not present

## 2022-06-03 DIAGNOSIS — M9902 Segmental and somatic dysfunction of thoracic region: Secondary | ICD-10-CM | POA: Diagnosis not present

## 2022-06-06 DIAGNOSIS — M9905 Segmental and somatic dysfunction of pelvic region: Secondary | ICD-10-CM | POA: Diagnosis not present

## 2022-06-06 DIAGNOSIS — M9901 Segmental and somatic dysfunction of cervical region: Secondary | ICD-10-CM | POA: Diagnosis not present

## 2022-06-06 DIAGNOSIS — M9902 Segmental and somatic dysfunction of thoracic region: Secondary | ICD-10-CM | POA: Diagnosis not present

## 2022-06-06 DIAGNOSIS — M9903 Segmental and somatic dysfunction of lumbar region: Secondary | ICD-10-CM | POA: Diagnosis not present

## 2022-06-20 DIAGNOSIS — M9901 Segmental and somatic dysfunction of cervical region: Secondary | ICD-10-CM | POA: Diagnosis not present

## 2022-06-20 DIAGNOSIS — M9905 Segmental and somatic dysfunction of pelvic region: Secondary | ICD-10-CM | POA: Diagnosis not present

## 2022-06-20 DIAGNOSIS — M9902 Segmental and somatic dysfunction of thoracic region: Secondary | ICD-10-CM | POA: Diagnosis not present

## 2022-06-20 DIAGNOSIS — M9903 Segmental and somatic dysfunction of lumbar region: Secondary | ICD-10-CM | POA: Diagnosis not present

## 2022-06-29 DIAGNOSIS — M9905 Segmental and somatic dysfunction of pelvic region: Secondary | ICD-10-CM | POA: Diagnosis not present

## 2022-06-29 DIAGNOSIS — M9901 Segmental and somatic dysfunction of cervical region: Secondary | ICD-10-CM | POA: Diagnosis not present

## 2022-06-29 DIAGNOSIS — M9902 Segmental and somatic dysfunction of thoracic region: Secondary | ICD-10-CM | POA: Diagnosis not present

## 2022-06-29 DIAGNOSIS — M9903 Segmental and somatic dysfunction of lumbar region: Secondary | ICD-10-CM | POA: Diagnosis not present

## 2022-07-22 DIAGNOSIS — M9903 Segmental and somatic dysfunction of lumbar region: Secondary | ICD-10-CM | POA: Diagnosis not present

## 2022-07-22 DIAGNOSIS — M9901 Segmental and somatic dysfunction of cervical region: Secondary | ICD-10-CM | POA: Diagnosis not present

## 2022-07-22 DIAGNOSIS — M9905 Segmental and somatic dysfunction of pelvic region: Secondary | ICD-10-CM | POA: Diagnosis not present

## 2022-07-22 DIAGNOSIS — M9902 Segmental and somatic dysfunction of thoracic region: Secondary | ICD-10-CM | POA: Diagnosis not present

## 2022-08-01 DIAGNOSIS — M9903 Segmental and somatic dysfunction of lumbar region: Secondary | ICD-10-CM | POA: Diagnosis not present

## 2022-08-01 DIAGNOSIS — M9902 Segmental and somatic dysfunction of thoracic region: Secondary | ICD-10-CM | POA: Diagnosis not present

## 2022-08-01 DIAGNOSIS — M9905 Segmental and somatic dysfunction of pelvic region: Secondary | ICD-10-CM | POA: Diagnosis not present

## 2022-08-01 DIAGNOSIS — M9901 Segmental and somatic dysfunction of cervical region: Secondary | ICD-10-CM | POA: Diagnosis not present

## 2022-08-15 DIAGNOSIS — M9903 Segmental and somatic dysfunction of lumbar region: Secondary | ICD-10-CM | POA: Diagnosis not present

## 2022-08-15 DIAGNOSIS — M9901 Segmental and somatic dysfunction of cervical region: Secondary | ICD-10-CM | POA: Diagnosis not present

## 2022-08-15 DIAGNOSIS — M9902 Segmental and somatic dysfunction of thoracic region: Secondary | ICD-10-CM | POA: Diagnosis not present

## 2022-08-15 DIAGNOSIS — M9905 Segmental and somatic dysfunction of pelvic region: Secondary | ICD-10-CM | POA: Diagnosis not present

## 2022-08-29 DIAGNOSIS — M9902 Segmental and somatic dysfunction of thoracic region: Secondary | ICD-10-CM | POA: Diagnosis not present

## 2022-08-29 DIAGNOSIS — M9901 Segmental and somatic dysfunction of cervical region: Secondary | ICD-10-CM | POA: Diagnosis not present

## 2022-08-29 DIAGNOSIS — M9903 Segmental and somatic dysfunction of lumbar region: Secondary | ICD-10-CM | POA: Diagnosis not present

## 2022-08-29 DIAGNOSIS — M9905 Segmental and somatic dysfunction of pelvic region: Secondary | ICD-10-CM | POA: Diagnosis not present

## 2022-09-09 DIAGNOSIS — M9903 Segmental and somatic dysfunction of lumbar region: Secondary | ICD-10-CM | POA: Diagnosis not present

## 2022-09-09 DIAGNOSIS — M9905 Segmental and somatic dysfunction of pelvic region: Secondary | ICD-10-CM | POA: Diagnosis not present

## 2022-09-09 DIAGNOSIS — M9902 Segmental and somatic dysfunction of thoracic region: Secondary | ICD-10-CM | POA: Diagnosis not present

## 2022-09-09 DIAGNOSIS — M9901 Segmental and somatic dysfunction of cervical region: Secondary | ICD-10-CM | POA: Diagnosis not present

## 2022-09-26 DIAGNOSIS — M9902 Segmental and somatic dysfunction of thoracic region: Secondary | ICD-10-CM | POA: Diagnosis not present

## 2022-09-26 DIAGNOSIS — M9903 Segmental and somatic dysfunction of lumbar region: Secondary | ICD-10-CM | POA: Diagnosis not present

## 2022-09-26 DIAGNOSIS — M9905 Segmental and somatic dysfunction of pelvic region: Secondary | ICD-10-CM | POA: Diagnosis not present

## 2022-09-26 DIAGNOSIS — M9901 Segmental and somatic dysfunction of cervical region: Secondary | ICD-10-CM | POA: Diagnosis not present

## 2022-09-28 DIAGNOSIS — H524 Presbyopia: Secondary | ICD-10-CM | POA: Diagnosis not present

## 2022-09-28 DIAGNOSIS — H2513 Age-related nuclear cataract, bilateral: Secondary | ICD-10-CM | POA: Diagnosis not present

## 2022-09-28 DIAGNOSIS — D3132 Benign neoplasm of left choroid: Secondary | ICD-10-CM | POA: Diagnosis not present

## 2022-09-28 DIAGNOSIS — H34232 Retinal artery branch occlusion, left eye: Secondary | ICD-10-CM | POA: Diagnosis not present

## 2022-09-28 DIAGNOSIS — H02839 Dermatochalasis of unspecified eye, unspecified eyelid: Secondary | ICD-10-CM | POA: Diagnosis not present

## 2022-09-28 DIAGNOSIS — H35013 Changes in retinal vascular appearance, bilateral: Secondary | ICD-10-CM | POA: Diagnosis not present

## 2022-09-28 DIAGNOSIS — H25013 Cortical age-related cataract, bilateral: Secondary | ICD-10-CM | POA: Diagnosis not present

## 2022-09-29 DIAGNOSIS — J069 Acute upper respiratory infection, unspecified: Secondary | ICD-10-CM | POA: Diagnosis not present

## 2022-10-01 DIAGNOSIS — L814 Other melanin hyperpigmentation: Secondary | ICD-10-CM | POA: Diagnosis not present

## 2022-10-01 DIAGNOSIS — L821 Other seborrheic keratosis: Secondary | ICD-10-CM | POA: Diagnosis not present

## 2022-10-01 DIAGNOSIS — L820 Inflamed seborrheic keratosis: Secondary | ICD-10-CM | POA: Diagnosis not present

## 2022-10-07 DIAGNOSIS — J069 Acute upper respiratory infection, unspecified: Secondary | ICD-10-CM | POA: Diagnosis not present

## 2022-10-13 DIAGNOSIS — Z125 Encounter for screening for malignant neoplasm of prostate: Secondary | ICD-10-CM | POA: Diagnosis not present

## 2022-10-13 DIAGNOSIS — I10 Essential (primary) hypertension: Secondary | ICD-10-CM | POA: Diagnosis not present

## 2022-10-13 DIAGNOSIS — R7301 Impaired fasting glucose: Secondary | ICD-10-CM | POA: Diagnosis not present

## 2022-10-13 DIAGNOSIS — E559 Vitamin D deficiency, unspecified: Secondary | ICD-10-CM | POA: Diagnosis not present

## 2022-10-13 DIAGNOSIS — R7982 Elevated C-reactive protein (CRP): Secondary | ICD-10-CM | POA: Diagnosis not present

## 2022-10-13 DIAGNOSIS — J069 Acute upper respiratory infection, unspecified: Secondary | ICD-10-CM | POA: Diagnosis not present

## 2022-10-13 DIAGNOSIS — E785 Hyperlipidemia, unspecified: Secondary | ICD-10-CM | POA: Diagnosis not present

## 2022-10-13 DIAGNOSIS — Z9861 Coronary angioplasty status: Secondary | ICD-10-CM | POA: Diagnosis not present

## 2022-10-13 DIAGNOSIS — I2510 Atherosclerotic heart disease of native coronary artery without angina pectoris: Secondary | ICD-10-CM | POA: Diagnosis not present

## 2022-12-21 DIAGNOSIS — M9903 Segmental and somatic dysfunction of lumbar region: Secondary | ICD-10-CM | POA: Diagnosis not present

## 2022-12-21 DIAGNOSIS — M9902 Segmental and somatic dysfunction of thoracic region: Secondary | ICD-10-CM | POA: Diagnosis not present

## 2022-12-21 DIAGNOSIS — M9901 Segmental and somatic dysfunction of cervical region: Secondary | ICD-10-CM | POA: Diagnosis not present

## 2022-12-21 DIAGNOSIS — M9905 Segmental and somatic dysfunction of pelvic region: Secondary | ICD-10-CM | POA: Diagnosis not present

## 2023-02-11 DIAGNOSIS — D485 Neoplasm of uncertain behavior of skin: Secondary | ICD-10-CM | POA: Diagnosis not present

## 2023-02-11 DIAGNOSIS — L820 Inflamed seborrheic keratosis: Secondary | ICD-10-CM | POA: Diagnosis not present

## 2023-02-11 DIAGNOSIS — L821 Other seborrheic keratosis: Secondary | ICD-10-CM | POA: Diagnosis not present

## 2023-02-17 DIAGNOSIS — M9905 Segmental and somatic dysfunction of pelvic region: Secondary | ICD-10-CM | POA: Diagnosis not present

## 2023-02-17 DIAGNOSIS — M9903 Segmental and somatic dysfunction of lumbar region: Secondary | ICD-10-CM | POA: Diagnosis not present

## 2023-02-17 DIAGNOSIS — M9901 Segmental and somatic dysfunction of cervical region: Secondary | ICD-10-CM | POA: Diagnosis not present

## 2023-02-17 DIAGNOSIS — M9902 Segmental and somatic dysfunction of thoracic region: Secondary | ICD-10-CM | POA: Diagnosis not present

## 2023-02-20 DIAGNOSIS — C44529 Squamous cell carcinoma of skin of other part of trunk: Secondary | ICD-10-CM | POA: Diagnosis not present

## 2023-03-03 DIAGNOSIS — M9905 Segmental and somatic dysfunction of pelvic region: Secondary | ICD-10-CM | POA: Diagnosis not present

## 2023-03-03 DIAGNOSIS — M9903 Segmental and somatic dysfunction of lumbar region: Secondary | ICD-10-CM | POA: Diagnosis not present

## 2023-03-03 DIAGNOSIS — M9901 Segmental and somatic dysfunction of cervical region: Secondary | ICD-10-CM | POA: Diagnosis not present

## 2023-03-03 DIAGNOSIS — M9902 Segmental and somatic dysfunction of thoracic region: Secondary | ICD-10-CM | POA: Diagnosis not present

## 2023-03-10 DIAGNOSIS — M9905 Segmental and somatic dysfunction of pelvic region: Secondary | ICD-10-CM | POA: Diagnosis not present

## 2023-03-10 DIAGNOSIS — M9902 Segmental and somatic dysfunction of thoracic region: Secondary | ICD-10-CM | POA: Diagnosis not present

## 2023-03-10 DIAGNOSIS — M9901 Segmental and somatic dysfunction of cervical region: Secondary | ICD-10-CM | POA: Diagnosis not present

## 2023-03-10 DIAGNOSIS — M9903 Segmental and somatic dysfunction of lumbar region: Secondary | ICD-10-CM | POA: Diagnosis not present

## 2023-03-17 DIAGNOSIS — M9905 Segmental and somatic dysfunction of pelvic region: Secondary | ICD-10-CM | POA: Diagnosis not present

## 2023-03-17 DIAGNOSIS — M9903 Segmental and somatic dysfunction of lumbar region: Secondary | ICD-10-CM | POA: Diagnosis not present

## 2023-03-17 DIAGNOSIS — M9902 Segmental and somatic dysfunction of thoracic region: Secondary | ICD-10-CM | POA: Diagnosis not present

## 2023-03-17 DIAGNOSIS — M9901 Segmental and somatic dysfunction of cervical region: Secondary | ICD-10-CM | POA: Diagnosis not present

## 2023-03-24 DIAGNOSIS — M9905 Segmental and somatic dysfunction of pelvic region: Secondary | ICD-10-CM | POA: Diagnosis not present

## 2023-03-24 DIAGNOSIS — M9901 Segmental and somatic dysfunction of cervical region: Secondary | ICD-10-CM | POA: Diagnosis not present

## 2023-03-24 DIAGNOSIS — M9903 Segmental and somatic dysfunction of lumbar region: Secondary | ICD-10-CM | POA: Diagnosis not present

## 2023-03-24 DIAGNOSIS — M9902 Segmental and somatic dysfunction of thoracic region: Secondary | ICD-10-CM | POA: Diagnosis not present

## 2023-03-31 DIAGNOSIS — M9902 Segmental and somatic dysfunction of thoracic region: Secondary | ICD-10-CM | POA: Diagnosis not present

## 2023-03-31 DIAGNOSIS — M9901 Segmental and somatic dysfunction of cervical region: Secondary | ICD-10-CM | POA: Diagnosis not present

## 2023-03-31 DIAGNOSIS — M9903 Segmental and somatic dysfunction of lumbar region: Secondary | ICD-10-CM | POA: Diagnosis not present

## 2023-03-31 DIAGNOSIS — M9905 Segmental and somatic dysfunction of pelvic region: Secondary | ICD-10-CM | POA: Diagnosis not present

## 2023-04-07 DIAGNOSIS — M9901 Segmental and somatic dysfunction of cervical region: Secondary | ICD-10-CM | POA: Diagnosis not present

## 2023-04-07 DIAGNOSIS — M9902 Segmental and somatic dysfunction of thoracic region: Secondary | ICD-10-CM | POA: Diagnosis not present

## 2023-04-07 DIAGNOSIS — M9903 Segmental and somatic dysfunction of lumbar region: Secondary | ICD-10-CM | POA: Diagnosis not present

## 2023-04-07 DIAGNOSIS — M9905 Segmental and somatic dysfunction of pelvic region: Secondary | ICD-10-CM | POA: Diagnosis not present

## 2023-04-14 DIAGNOSIS — M9902 Segmental and somatic dysfunction of thoracic region: Secondary | ICD-10-CM | POA: Diagnosis not present

## 2023-04-14 DIAGNOSIS — I2510 Atherosclerotic heart disease of native coronary artery without angina pectoris: Secondary | ICD-10-CM | POA: Diagnosis not present

## 2023-04-14 DIAGNOSIS — M9901 Segmental and somatic dysfunction of cervical region: Secondary | ICD-10-CM | POA: Diagnosis not present

## 2023-04-14 DIAGNOSIS — Z9861 Coronary angioplasty status: Secondary | ICD-10-CM | POA: Diagnosis not present

## 2023-04-14 DIAGNOSIS — E785 Hyperlipidemia, unspecified: Secondary | ICD-10-CM | POA: Diagnosis not present

## 2023-04-14 DIAGNOSIS — R7982 Elevated C-reactive protein (CRP): Secondary | ICD-10-CM | POA: Diagnosis not present

## 2023-04-14 DIAGNOSIS — M9905 Segmental and somatic dysfunction of pelvic region: Secondary | ICD-10-CM | POA: Diagnosis not present

## 2023-04-14 DIAGNOSIS — I10 Essential (primary) hypertension: Secondary | ICD-10-CM | POA: Diagnosis not present

## 2023-04-14 DIAGNOSIS — E559 Vitamin D deficiency, unspecified: Secondary | ICD-10-CM | POA: Diagnosis not present

## 2023-04-14 DIAGNOSIS — Z125 Encounter for screening for malignant neoplasm of prostate: Secondary | ICD-10-CM | POA: Diagnosis not present

## 2023-04-14 DIAGNOSIS — M9903 Segmental and somatic dysfunction of lumbar region: Secondary | ICD-10-CM | POA: Diagnosis not present

## 2023-04-14 DIAGNOSIS — R7301 Impaired fasting glucose: Secondary | ICD-10-CM | POA: Diagnosis not present

## 2023-04-14 DIAGNOSIS — E291 Testicular hypofunction: Secondary | ICD-10-CM | POA: Diagnosis not present

## 2023-04-25 DIAGNOSIS — M9902 Segmental and somatic dysfunction of thoracic region: Secondary | ICD-10-CM | POA: Diagnosis not present

## 2023-04-25 DIAGNOSIS — M9901 Segmental and somatic dysfunction of cervical region: Secondary | ICD-10-CM | POA: Diagnosis not present

## 2023-04-25 DIAGNOSIS — M9903 Segmental and somatic dysfunction of lumbar region: Secondary | ICD-10-CM | POA: Diagnosis not present

## 2023-04-25 DIAGNOSIS — M9905 Segmental and somatic dysfunction of pelvic region: Secondary | ICD-10-CM | POA: Diagnosis not present

## 2023-05-05 DIAGNOSIS — M9905 Segmental and somatic dysfunction of pelvic region: Secondary | ICD-10-CM | POA: Diagnosis not present

## 2023-05-05 DIAGNOSIS — M9903 Segmental and somatic dysfunction of lumbar region: Secondary | ICD-10-CM | POA: Diagnosis not present

## 2023-05-05 DIAGNOSIS — M9901 Segmental and somatic dysfunction of cervical region: Secondary | ICD-10-CM | POA: Diagnosis not present

## 2023-05-05 DIAGNOSIS — M9902 Segmental and somatic dysfunction of thoracic region: Secondary | ICD-10-CM | POA: Diagnosis not present

## 2023-05-19 DIAGNOSIS — M9902 Segmental and somatic dysfunction of thoracic region: Secondary | ICD-10-CM | POA: Diagnosis not present

## 2023-05-19 DIAGNOSIS — M9903 Segmental and somatic dysfunction of lumbar region: Secondary | ICD-10-CM | POA: Diagnosis not present

## 2023-05-19 DIAGNOSIS — M9901 Segmental and somatic dysfunction of cervical region: Secondary | ICD-10-CM | POA: Diagnosis not present

## 2023-05-19 DIAGNOSIS — M9905 Segmental and somatic dysfunction of pelvic region: Secondary | ICD-10-CM | POA: Diagnosis not present

## 2023-05-20 DIAGNOSIS — L821 Other seborrheic keratosis: Secondary | ICD-10-CM | POA: Diagnosis not present

## 2023-05-20 DIAGNOSIS — L720 Epidermal cyst: Secondary | ICD-10-CM | POA: Diagnosis not present

## 2023-05-24 DIAGNOSIS — K4090 Unilateral inguinal hernia, without obstruction or gangrene, not specified as recurrent: Secondary | ICD-10-CM | POA: Diagnosis not present

## 2023-05-26 DIAGNOSIS — M9905 Segmental and somatic dysfunction of pelvic region: Secondary | ICD-10-CM | POA: Diagnosis not present

## 2023-05-26 DIAGNOSIS — M9901 Segmental and somatic dysfunction of cervical region: Secondary | ICD-10-CM | POA: Diagnosis not present

## 2023-05-26 DIAGNOSIS — M9903 Segmental and somatic dysfunction of lumbar region: Secondary | ICD-10-CM | POA: Diagnosis not present

## 2023-05-26 DIAGNOSIS — M9902 Segmental and somatic dysfunction of thoracic region: Secondary | ICD-10-CM | POA: Diagnosis not present

## 2023-06-05 DIAGNOSIS — L720 Epidermal cyst: Secondary | ICD-10-CM | POA: Diagnosis not present

## 2023-06-09 DIAGNOSIS — M9901 Segmental and somatic dysfunction of cervical region: Secondary | ICD-10-CM | POA: Diagnosis not present

## 2023-06-09 DIAGNOSIS — M9903 Segmental and somatic dysfunction of lumbar region: Secondary | ICD-10-CM | POA: Diagnosis not present

## 2023-06-09 DIAGNOSIS — M9905 Segmental and somatic dysfunction of pelvic region: Secondary | ICD-10-CM | POA: Diagnosis not present

## 2023-06-09 DIAGNOSIS — M9902 Segmental and somatic dysfunction of thoracic region: Secondary | ICD-10-CM | POA: Diagnosis not present

## 2023-06-12 DIAGNOSIS — K4090 Unilateral inguinal hernia, without obstruction or gangrene, not specified as recurrent: Secondary | ICD-10-CM | POA: Diagnosis not present

## 2023-06-12 DIAGNOSIS — I10 Essential (primary) hypertension: Secondary | ICD-10-CM | POA: Diagnosis not present

## 2023-06-12 DIAGNOSIS — Z7982 Long term (current) use of aspirin: Secondary | ICD-10-CM | POA: Diagnosis not present

## 2023-06-12 DIAGNOSIS — Z79899 Other long term (current) drug therapy: Secondary | ICD-10-CM | POA: Diagnosis not present

## 2023-07-10 DIAGNOSIS — R109 Unspecified abdominal pain: Secondary | ICD-10-CM | POA: Diagnosis not present

## 2023-07-10 DIAGNOSIS — R1013 Epigastric pain: Secondary | ICD-10-CM | POA: Diagnosis not present

## 2023-07-10 DIAGNOSIS — R110 Nausea: Secondary | ICD-10-CM | POA: Diagnosis not present

## 2023-10-05 DIAGNOSIS — L820 Inflamed seborrheic keratosis: Secondary | ICD-10-CM | POA: Diagnosis not present

## 2023-10-05 DIAGNOSIS — L728 Other follicular cysts of the skin and subcutaneous tissue: Secondary | ICD-10-CM | POA: Diagnosis not present

## 2023-10-10 DIAGNOSIS — L720 Epidermal cyst: Secondary | ICD-10-CM | POA: Diagnosis not present

## 2023-10-13 DIAGNOSIS — I2510 Atherosclerotic heart disease of native coronary artery without angina pectoris: Secondary | ICD-10-CM | POA: Diagnosis not present

## 2023-10-13 DIAGNOSIS — G720 Drug-induced myopathy: Secondary | ICD-10-CM | POA: Diagnosis not present

## 2023-10-13 DIAGNOSIS — Z139 Encounter for screening, unspecified: Secondary | ICD-10-CM | POA: Diagnosis not present

## 2023-10-13 DIAGNOSIS — Z9861 Coronary angioplasty status: Secondary | ICD-10-CM | POA: Diagnosis not present

## 2023-10-13 DIAGNOSIS — E785 Hyperlipidemia, unspecified: Secondary | ICD-10-CM | POA: Diagnosis not present

## 2023-10-13 DIAGNOSIS — R5382 Chronic fatigue, unspecified: Secondary | ICD-10-CM | POA: Diagnosis not present

## 2023-10-13 DIAGNOSIS — I10 Essential (primary) hypertension: Secondary | ICD-10-CM | POA: Diagnosis not present

## 2023-10-13 DIAGNOSIS — E559 Vitamin D deficiency, unspecified: Secondary | ICD-10-CM | POA: Diagnosis not present

## 2023-10-13 DIAGNOSIS — R7982 Elevated C-reactive protein (CRP): Secondary | ICD-10-CM | POA: Diagnosis not present

## 2023-10-13 DIAGNOSIS — T466X5A Adverse effect of antihyperlipidemic and antiarteriosclerotic drugs, initial encounter: Secondary | ICD-10-CM | POA: Diagnosis not present

## 2023-10-13 DIAGNOSIS — R7301 Impaired fasting glucose: Secondary | ICD-10-CM | POA: Diagnosis not present

## 2023-11-29 DIAGNOSIS — M9902 Segmental and somatic dysfunction of thoracic region: Secondary | ICD-10-CM | POA: Diagnosis not present

## 2023-11-29 DIAGNOSIS — M9905 Segmental and somatic dysfunction of pelvic region: Secondary | ICD-10-CM | POA: Diagnosis not present

## 2023-11-29 DIAGNOSIS — M9903 Segmental and somatic dysfunction of lumbar region: Secondary | ICD-10-CM | POA: Diagnosis not present

## 2023-11-29 DIAGNOSIS — M9901 Segmental and somatic dysfunction of cervical region: Secondary | ICD-10-CM | POA: Diagnosis not present

## 2024-02-02 DIAGNOSIS — L820 Inflamed seborrheic keratosis: Secondary | ICD-10-CM | POA: Diagnosis not present

## 2024-04-12 DIAGNOSIS — I10 Essential (primary) hypertension: Secondary | ICD-10-CM | POA: Diagnosis not present

## 2024-04-12 DIAGNOSIS — R7982 Elevated C-reactive protein (CRP): Secondary | ICD-10-CM | POA: Diagnosis not present

## 2024-04-12 DIAGNOSIS — R7301 Impaired fasting glucose: Secondary | ICD-10-CM | POA: Diagnosis not present

## 2024-04-12 DIAGNOSIS — R5382 Chronic fatigue, unspecified: Secondary | ICD-10-CM | POA: Diagnosis not present

## 2024-04-12 DIAGNOSIS — E785 Hyperlipidemia, unspecified: Secondary | ICD-10-CM | POA: Diagnosis not present

## 2024-04-12 DIAGNOSIS — G720 Drug-induced myopathy: Secondary | ICD-10-CM | POA: Diagnosis not present

## 2024-04-12 DIAGNOSIS — I2510 Atherosclerotic heart disease of native coronary artery without angina pectoris: Secondary | ICD-10-CM | POA: Diagnosis not present

## 2024-04-12 DIAGNOSIS — T466X5A Adverse effect of antihyperlipidemic and antiarteriosclerotic drugs, initial encounter: Secondary | ICD-10-CM | POA: Diagnosis not present

## 2024-04-12 DIAGNOSIS — Z125 Encounter for screening for malignant neoplasm of prostate: Secondary | ICD-10-CM | POA: Diagnosis not present

## 2024-04-12 DIAGNOSIS — E559 Vitamin D deficiency, unspecified: Secondary | ICD-10-CM | POA: Diagnosis not present

## 2024-04-12 DIAGNOSIS — Z9861 Coronary angioplasty status: Secondary | ICD-10-CM | POA: Diagnosis not present

## 2024-06-26 DIAGNOSIS — R7301 Impaired fasting glucose: Secondary | ICD-10-CM | POA: Diagnosis not present

## 2024-06-26 DIAGNOSIS — Z1329 Encounter for screening for other suspected endocrine disorder: Secondary | ICD-10-CM | POA: Diagnosis not present

## 2024-06-26 DIAGNOSIS — E785 Hyperlipidemia, unspecified: Secondary | ICD-10-CM | POA: Diagnosis not present

## 2024-06-26 DIAGNOSIS — E559 Vitamin D deficiency, unspecified: Secondary | ICD-10-CM | POA: Diagnosis not present

## 2024-06-26 DIAGNOSIS — G609 Hereditary and idiopathic neuropathy, unspecified: Secondary | ICD-10-CM | POA: Diagnosis not present

## 2024-06-26 DIAGNOSIS — R001 Bradycardia, unspecified: Secondary | ICD-10-CM | POA: Diagnosis not present

## 2024-06-26 DIAGNOSIS — R200 Anesthesia of skin: Secondary | ICD-10-CM | POA: Diagnosis not present

## 2024-06-26 DIAGNOSIS — Z131 Encounter for screening for diabetes mellitus: Secondary | ICD-10-CM | POA: Diagnosis not present

## 2024-08-10 DIAGNOSIS — L570 Actinic keratosis: Secondary | ICD-10-CM | POA: Diagnosis not present

## 2024-08-10 DIAGNOSIS — L821 Other seborrheic keratosis: Secondary | ICD-10-CM | POA: Diagnosis not present

## 2024-08-10 DIAGNOSIS — L814 Other melanin hyperpigmentation: Secondary | ICD-10-CM | POA: Diagnosis not present
